# Patient Record
Sex: Male | Born: 1985 | Hispanic: Yes | Marital: Single | Smoking: Never smoker
Health system: Southern US, Community
[De-identification: ages and names within clinical notes are randomized; demographics above are authoritative.]

---

## 2020-09-04 ENCOUNTER — Emergency Department (HOSPITAL_COMMUNITY)
Admission: EM | Admit: 2020-09-04 | Discharge: 2020-09-04 | Disposition: A | Discharge status: Home / Self Care | Payer: Self-pay | Attending: Emergency Medicine | Admitting: Emergency Medicine

## 2020-09-04 ENCOUNTER — Other Ambulatory Visit: Payer: Self-pay

## 2020-09-04 ENCOUNTER — Emergency Department (HOSPITAL_COMMUNITY): Payer: Self-pay

## 2020-09-04 ENCOUNTER — Encounter (HOSPITAL_COMMUNITY): Payer: Self-pay

## 2020-09-04 DIAGNOSIS — R079 Chest pain, unspecified: Secondary | ICD-10-CM

## 2020-09-04 DIAGNOSIS — K2100 Gastro-esophageal reflux disease with esophagitis, without bleeding: Secondary | ICD-10-CM | POA: Insufficient documentation

## 2020-09-04 LAB — BASIC METABOLIC PANEL
Anion gap: 9 (ref 5–15)
BUN: 12 mg/dL (ref 6–20)
CO2: 23 mmol/L (ref 22–32)
Calcium: 9.5 mg/dL (ref 8.9–10.3)
Chloride: 106 mmol/L (ref 98–111)
Creatinine, Ser: 0.89 mg/dL (ref 0.61–1.24)
GFR, Estimated: >60 mL/min (ref >60)
Glucose, Bld: 104 mg/dL — ABNORMAL HIGH (ref 70–99)
Potassium: 4.3 mmol/L (ref 3.5–5.1)
Sodium: 138 mmol/L (ref 135–145)

## 2020-09-04 LAB — CBC
HCT: 47.3 % (ref 39.0–52.0)
Hemoglobin: 15.6 g/dL (ref 13.0–17.0)
MCH: 30.6 pg (ref 26.0–34.0)
MCHC: 33 g/dL (ref 30.0–36.0)
MCV: 92.7 fL (ref 80.0–100.0)
Platelets: 219 10*3/uL (ref 150–400)
RBC: 5.1 MIL/uL (ref 4.22–5.81)
RDW: 12.3 % (ref 11.5–15.5)
WBC: 8.9 10*3/uL (ref 4.0–10.5)
nRBC: 0 % (ref 0.0–0.2)

## 2020-09-04 LAB — TROPONIN I (HIGH SENSITIVITY)
Troponin I (High Sensitivity): 5 ng/L (ref <18)
Troponin I (High Sensitivity): 5 ng/L (ref <18)

## 2020-09-04 MED ORDER — OMEPRAZOLE 20 MG PO CPDR: 20.0000 mg | DELAYED_RELEASE_CAPSULE | Freq: Every day | ORAL | 0 refills | Status: AC

## 2020-09-04 NOTE — ED Provider Notes (Signed)
[facility] EMERGENCY DEPARTMENT Provider Note   CSN: [id number] Arrival date & time: 09/04/20  1430     History Chief Complaint  Patient presents with  . Chest Pain    Anthony Green is a 35 y.o. male.  The history is provided by the patient. The history is limited by a language barrier. A language interpreter was used.  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 Exacerbated by: moving his arms, and shifting his body.  not worse with breathing. Ineffective treatments:  None tried Associated symptoms: abdominal pain and shortness of breath   Associated symptoms: no anorexia, no back pain, no cough, no dizziness, no fever, no headache, no lower extremity edema, no numbness, no vomiting and no weakness   Risk factors: no diabetes mellitus, no hypertension, not obese, no prior DVT/PE, no smoking and no surgery   Risk factors comment:  No family hx of heart disease.  no tobacco, alcohol or drug use.        No past medical history on file.  There are no problems to display for this patient.        No family history on file.  Social History   Tobacco Use  . Smoking status: Never Smoker  . Smokeless tobacco: Never Used    Home Medications Prior to Admission medications   Medication Sig Start Date End Date Taking? Authorizing Provider  omeprazole (PRILOSEC) 20 MG capsule Take 1 capsule (20 mg total) by mouth daily. 09/04/20  Yes Gwyneth Sprout, MD    Allergies    Patient has no allergy information on record.  Review of Systems    Review of Systems  Constitutional: Negative for fever.  Respiratory: Positive for shortness of breath. Negative for cough.   Cardiovascular: Positive for chest pain.  Gastrointestinal: Positive for abdominal pain. Negative for anorexia and vomiting.  Musculoskeletal: Negative for back pain.  Neurological: Negative for dizziness, weakness, numbness and headaches.  All other systems reviewed and are negative.   Physical Exam Updated Vital Signs BP 121/87 (BP Location: Left Arm)   Pulse 76   Temp 97.8 F (36.6 C) (Temporal)   Resp 16   SpO2 99%   Physical Exam Vitals and nursing note reviewed.  Constitutional:      General: He is not in acute distress.    Appearance: He is well-developed, normal weight and well-nourished.  HENT:     Head: Normocephalic and atraumatic.     Mouth/Throat:     Mouth: Oropharynx is clear and moist.  Eyes:     Extraocular Movements: EOM normal.     Conjunctiva/sclera: Conjunctivae normal.     Pupils: Pupils are equal, round, and reactive to light.  Cardiovascular:     Rate and Rhythm: Normal rate and regular rhythm.     Pulses: Intact distal pulses.     Heart sounds: No murmur heard.   Pulmonary:     Effort: Pulmonary effort is normal. No respiratory distress.     Breath sounds: Normal breath sounds. No wheezing or rales.  Chest:  Chest wall: No tenderness.  Abdominal:     General: There is no distension.     Palpations: Abdomen is soft.     Tenderness: There is abdominal tenderness in the epigastric area. There is guarding. There is no right CVA tenderness, left CVA tenderness or rebound.    Musculoskeletal:        General: No tenderness or edema. Normal range of motion.     Cervical back: Normal range of motion and neck supple.     Right lower leg: No edema.     Left lower leg: No edema.  Skin:    General: Skin is warm and dry.     Findings: No erythema or rash.  Neurological:     General: No focal deficit present.     Mental  Status: He is alert and oriented to person, place, and time. Mental status is at baseline.  Psychiatric:        Mood and Affect: Mood and affect and mood normal.        Behavior: Behavior normal.        Thought Content: Thought content normal.      ED Results / Procedures / Treatments   Labs (all labs ordered are listed, but only abnormal results are displayed) Labs Reviewed  BASIC METABOLIC PANEL - Abnormal; Notable for the following components:      Result Value   Glucose, Bld 104 (*)    All other components within normal limits  CBC  TROPONIN I (HIGH SENSITIVITY)  TROPONIN I (HIGH SENSITIVITY)    EKG EKG Interpretation  Date/Time:  Thursday September 04 2020 15:05:10 EST Ventricular Rate:  83 PR Interval:  164 QRS Duration: 92 QT Interval:  356 QTC Calculation: 418 R Axis:   76 Text Interpretation: Normal sinus rhythm Normal ECG Confirmed by Virgina Norfolk [phone]) on 09/04/2020 8:45:25 PM   Radiology DG Chest 2 View  Result Date: 09/04/2020 CLINICAL DATA:  Chest pain EXAM: CHEST - 2 VIEW COMPARISON:  None. FINDINGS: Heart size and vascularity normal. Mild atelectasis in the lung bases. Negative for pneumonia or effusion. IMPRESSION: Mild bibasilar atelectasis. Electronically Signed   By: Marlan Palau M.D.   On: 09/04/2020 15:30    Procedures Procedures (including critical care time)  Medications Ordered in ED Medications - No data to display  ED Course  I have reviewed the triage vital signs and the nursing notes.  Pertinent labs & imaging results that were available during my care of the patient were reviewed by me and considered in my medical decision making (see chart for details).    MDM Rules/Calculators/A&P                          Patient presenting today with an episode of chest pain while he was at work that started around 2:00 this afternoon.  He reports the symptoms lasted for approximately 30 minutes and then resolved.  He has not had any pain  since and has felt his normal self while waiting in the waiting room for the last 7 hours.  He has been up and moving around and has not had any recurrence of the pain.  When he was having the pain it was worse when he would move his arms and change positions.  He does deny any recent infectious symptoms and has not had cough or history of asthma.  He reports feeling normal currently.  He does report having a lot of  heartburn but no other particular symptoms.  On exam he does have significant epigastric pain but no other acute findings.  Vital signs are within normal limits, patient's labs including troponin x2 are negative.  His EKG showed a normal sinus rhythm.  Heart score of 1.  Patient is otherwise young and healthy and has no risk factors concerning for PE.  Symptoms seem atypical for dissection and no findings suggestive of pericarditis or myocarditis.  Suspect patient's symptoms are most likely rated GERD and esophageal spasm.  We will treat with a PPI, patient given reassurance and return precautions.  MDM Number of Diagnoses or Management Options   Amount and/or Complexity of Data Reviewed Clinical lab tests: reviewed and ordered Tests in the radiology section of CPT: ordered and reviewed Tests in the medicine section of CPT: ordered and reviewed Decide to obtain previous medical records or to obtain history from someone other than the patient: yes Obtain history from someone other than the patient: no Review and summarize past medical records: yes Discuss the patient with other providers: no Independent visualization of images, tracings, or specimens: yes  Risk of Complications, Morbidity, and/or Mortality Presenting problems: low Diagnostic procedures: minimal Management options: minimal  Patient Progress Patient progress: stable  Final Clinical Impression(s) / ED Diagnoses Final diagnoses:  Gastroesophageal reflux disease with esophagitis without hemorrhage  Nonspecific chest  pain    Rx / DC Orders ED Discharge Orders         Ordered    omeprazole (PRILOSEC) 20 MG capsule  Daily        09/04/20 2208           Gwyneth Sprout, MD 09/04/20 2315

## 2020-09-04 NOTE — Discharge Instructions (Addendum)
Blood pressure normal.  Blood work and heart are normal.

## 2020-09-04 NOTE — ED Triage Notes (Signed)
Pt reports today at 8am he developed cp. Pt reports the pain felt like pressure / squeezing sensation.

## 2021-09-22 IMAGING — DX DG CHEST 2V
2 series · 2 of 2 positions shown · non-contrast
Comparison: None.

CLINICAL DATA: Chest pain

EXAM:
CHEST - 2 VIEW

[w chest pa]
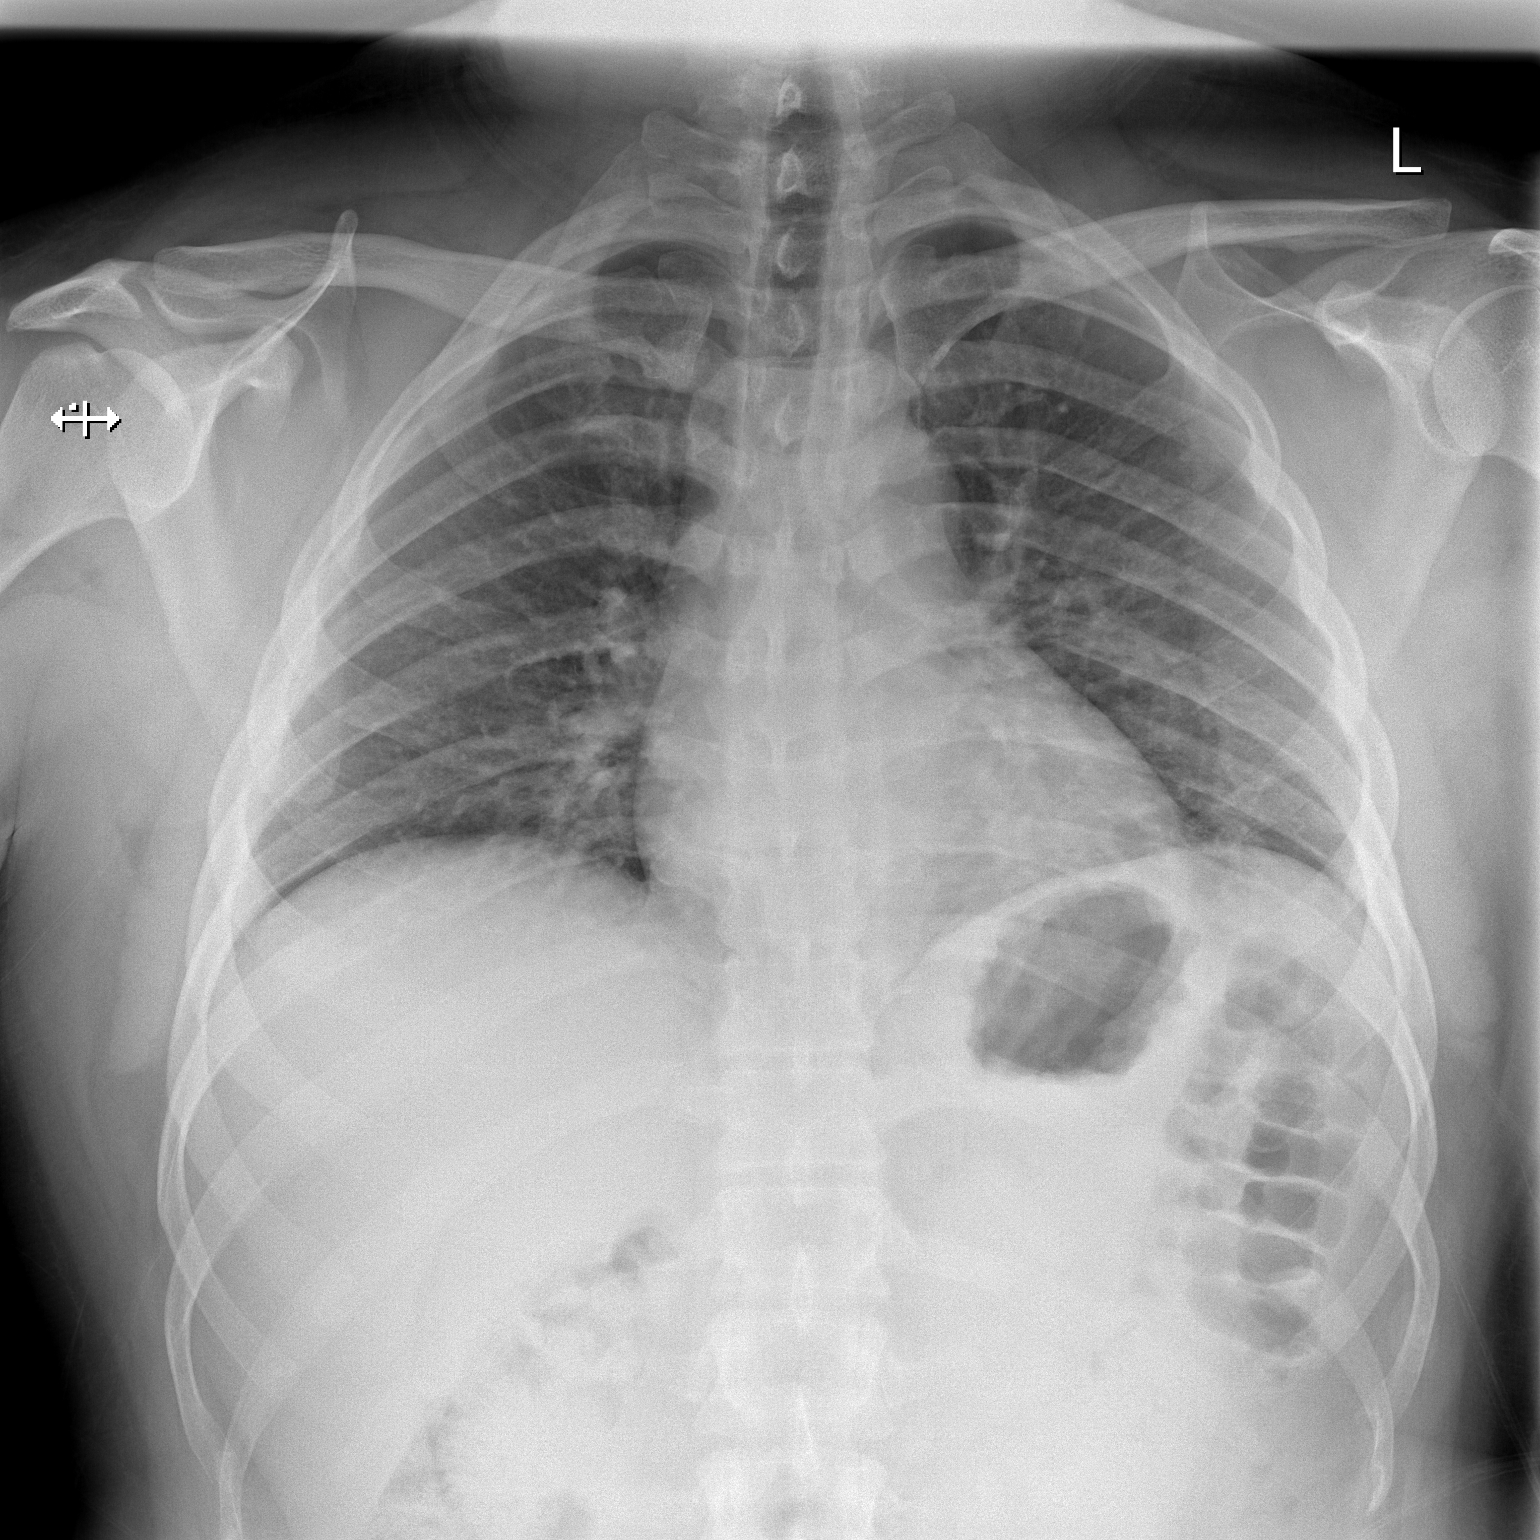

[w chest lat]
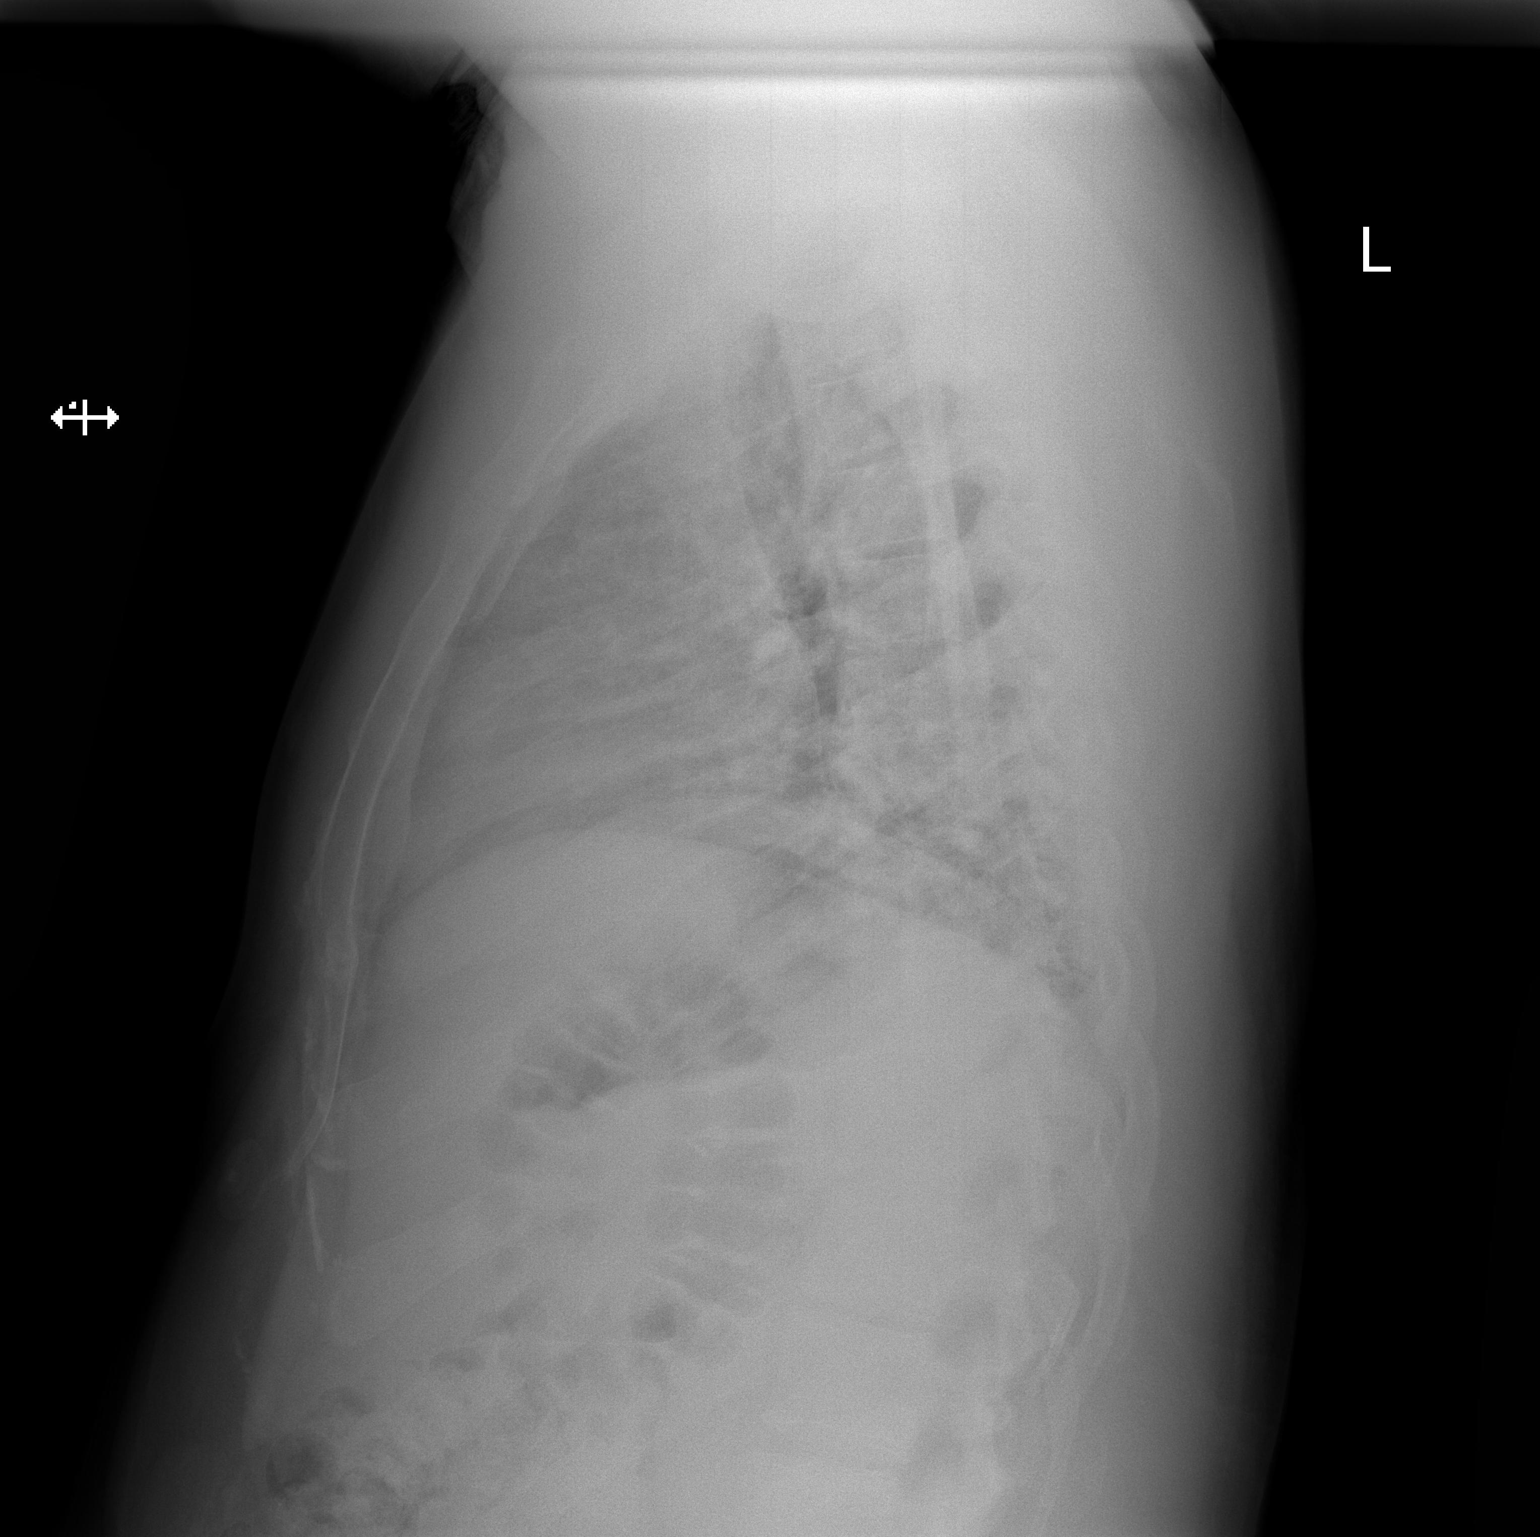

[2 of 2 positions shown; findings below may reference images not displayed]

FINDINGS: Heart size and vascularity normal. Mild atelectasis in the lung
bases. Negative for pneumonia or effusion.
IMPRESSION: Mild bibasilar atelectasis.
